# Patient Record
Sex: Female | Born: 1965 | Race: White | Hispanic: No | Marital: Married
Health system: Southern US, Community
[De-identification: ages and names within clinical notes are randomized; demographics above are authoritative.]

---

## 1998-11-06 ENCOUNTER — Other Ambulatory Visit: Admission: RE | Admit: 1998-11-06 | Discharge: 1998-11-06 | Payer: Self-pay | Admitting: Obstetrics & Gynecology

## 2000-02-23 ENCOUNTER — Other Ambulatory Visit: Admission: RE | Admit: 2000-02-23 | Discharge: 2000-02-23 | Payer: Self-pay | Admitting: Obstetrics & Gynecology

## 2001-06-12 ENCOUNTER — Other Ambulatory Visit: Admission: RE | Admit: 2001-06-12 | Discharge: 2001-06-12 | Payer: Self-pay | Admitting: Obstetrics & Gynecology

## 2003-10-08 ENCOUNTER — Other Ambulatory Visit: Admission: RE | Admit: 2003-10-08 | Discharge: 2003-10-08 | Payer: Self-pay | Admitting: Obstetrics & Gynecology

## 2004-10-19 ENCOUNTER — Other Ambulatory Visit: Admission: RE | Admit: 2004-10-19 | Discharge: 2004-10-19 | Payer: Self-pay | Admitting: Obstetrics & Gynecology

## 2005-11-10 ENCOUNTER — Ambulatory Visit (HOSPITAL_COMMUNITY): Admission: RE | Admit: 2005-11-10 | Discharge: 2005-11-11 | Payer: Self-pay | Admitting: Obstetrics and Gynecology

## 2015-12-02 ENCOUNTER — Other Ambulatory Visit: Payer: Self-pay | Admitting: Family Medicine

## 2015-12-02 DIAGNOSIS — E041 Nontoxic single thyroid nodule: Secondary | ICD-10-CM

## 2015-12-10 ENCOUNTER — Ambulatory Visit
Admission: RE | Admit: 2015-12-10 | Discharge: 2015-12-10 | Disposition: A | Discharge status: Home / Self Care | Payer: BC Managed Care – PPO | Source: Ambulatory Visit | Attending: Family Medicine | Admitting: Family Medicine

## 2015-12-10 DIAGNOSIS — E041 Nontoxic single thyroid nodule: Secondary | ICD-10-CM

## 2015-12-23 ENCOUNTER — Other Ambulatory Visit: Payer: Self-pay | Admitting: Family Medicine

## 2015-12-23 DIAGNOSIS — E041 Nontoxic single thyroid nodule: Secondary | ICD-10-CM

## 2015-12-30 ENCOUNTER — Ambulatory Visit
Admission: RE | Admit: 2015-12-30 | Discharge: 2015-12-30 | Disposition: A | Discharge status: Home / Self Care | Payer: BC Managed Care – PPO | Source: Ambulatory Visit | Attending: Family Medicine | Admitting: Family Medicine

## 2015-12-30 ENCOUNTER — Other Ambulatory Visit (HOSPITAL_COMMUNITY)
Admission: RE | Admit: 2015-12-30 | Discharge: 2015-12-30 | Disposition: A | Discharge status: Home / Self Care | Payer: BC Managed Care – PPO | Source: Ambulatory Visit | Attending: Radiology | Admitting: Radiology

## 2015-12-30 DIAGNOSIS — E041 Nontoxic single thyroid nodule: Secondary | ICD-10-CM | POA: Diagnosis present

## 2017-09-22 ENCOUNTER — Other Ambulatory Visit (HOSPITAL_COMMUNITY): Payer: Self-pay | Admitting: Family Medicine

## 2017-09-22 ENCOUNTER — Ambulatory Visit (HOSPITAL_COMMUNITY)
Admission: RE | Admit: 2017-09-22 | Discharge: 2017-09-22 | Disposition: A | Discharge status: Home / Self Care | Payer: BC Managed Care – PPO | Source: Ambulatory Visit | Attending: Family Medicine | Admitting: Family Medicine

## 2017-09-22 DIAGNOSIS — I10 Essential (primary) hypertension: Secondary | ICD-10-CM | POA: Insufficient documentation

## 2017-09-22 NOTE — Progress Notes (Signed)
Renal artery duplex prelim: no evidence of renal artery stenosis. Farrel DemarkJill Eunice, RDMS, RVT

## 2021-12-01 ENCOUNTER — Other Ambulatory Visit: Payer: Self-pay | Admitting: Obstetrics & Gynecology

## 2021-12-01 DIAGNOSIS — N6310 Unspecified lump in the right breast, unspecified quadrant: Secondary | ICD-10-CM

## 2021-12-13 ENCOUNTER — Ambulatory Visit
Admission: RE | Admit: 2021-12-13 | Discharge: 2021-12-13 | Disposition: A | Discharge status: Home / Self Care | Payer: PRIVATE HEALTH INSURANCE | Source: Ambulatory Visit | Attending: Obstetrics & Gynecology | Admitting: Obstetrics & Gynecology

## 2021-12-13 ENCOUNTER — Ambulatory Visit
Admission: RE | Admit: 2021-12-13 | Discharge: 2021-12-13 | Disposition: A | Discharge status: Home / Self Care | Payer: BC Managed Care – PPO | Source: Ambulatory Visit | Attending: Obstetrics & Gynecology | Admitting: Obstetrics & Gynecology

## 2021-12-13 DIAGNOSIS — N6310 Unspecified lump in the right breast, unspecified quadrant: Secondary | ICD-10-CM

## 2023-11-29 IMAGING — MG DIGITAL DIAGNOSTIC BILAT W/ TOMO W/ CAD
6 of 10 series · 6 of 30 positions shown · non-contrast
Comparison: Previous exam(s).

CLINICAL DATA: 56-year-old female with palpable OUTER RIGHT breast
mass identified on self-examination, and for annual bilateral
mammogram.

EXAM:
DIGITAL DIAGNOSTIC BILATERAL MAMMOGRAM WITH TOMOSYNTHESIS AND CAD;
ULTRASOUND RIGHT BREAST LIMITED
TECHNIQUE: Bilateral digital diagnostic mammography and breast tomosynthesis
was performed. The images were evaluated with computer-aided
detection.; Targeted ultrasound examination of the right breast was
performed

[L CC synth-2D]
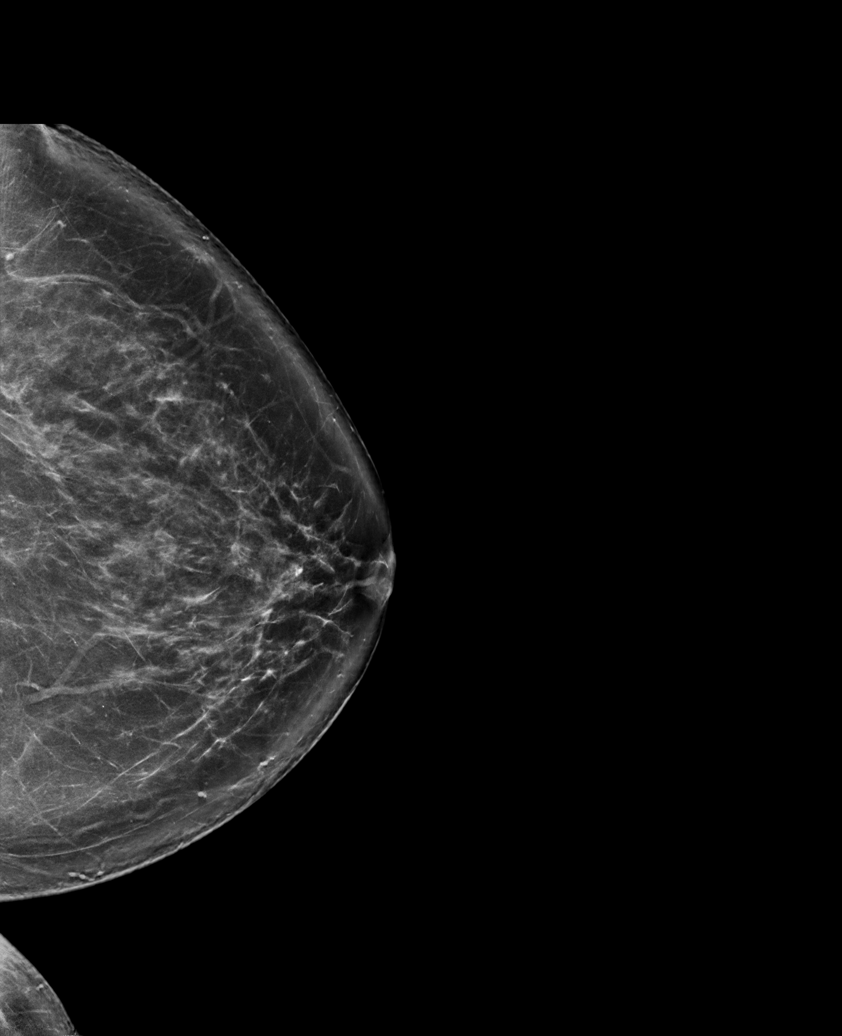

[L MLO synth-2D]
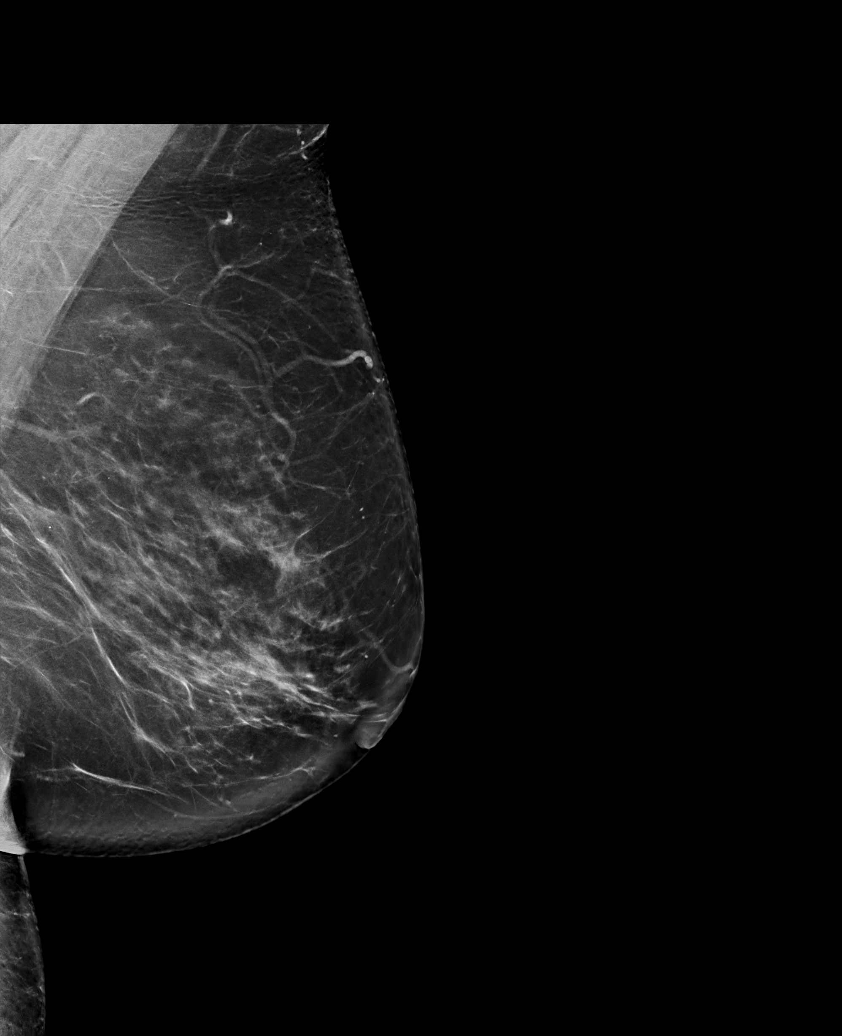

[R TAN synth-2D]
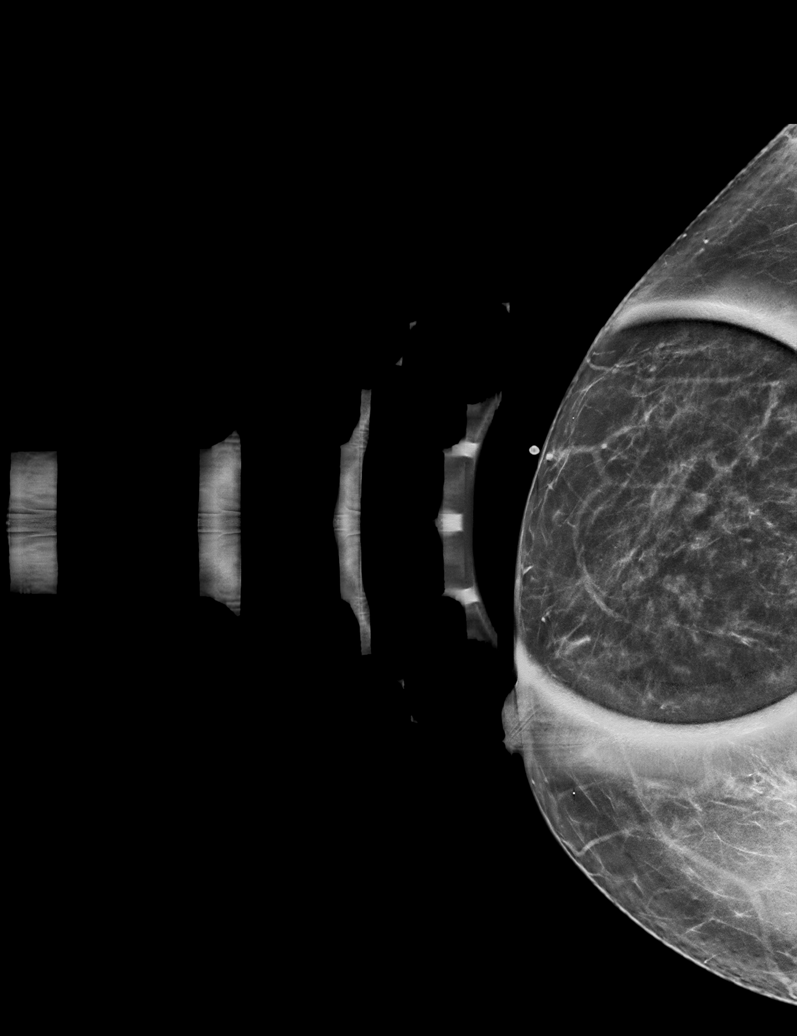

[R CC synth-2D]
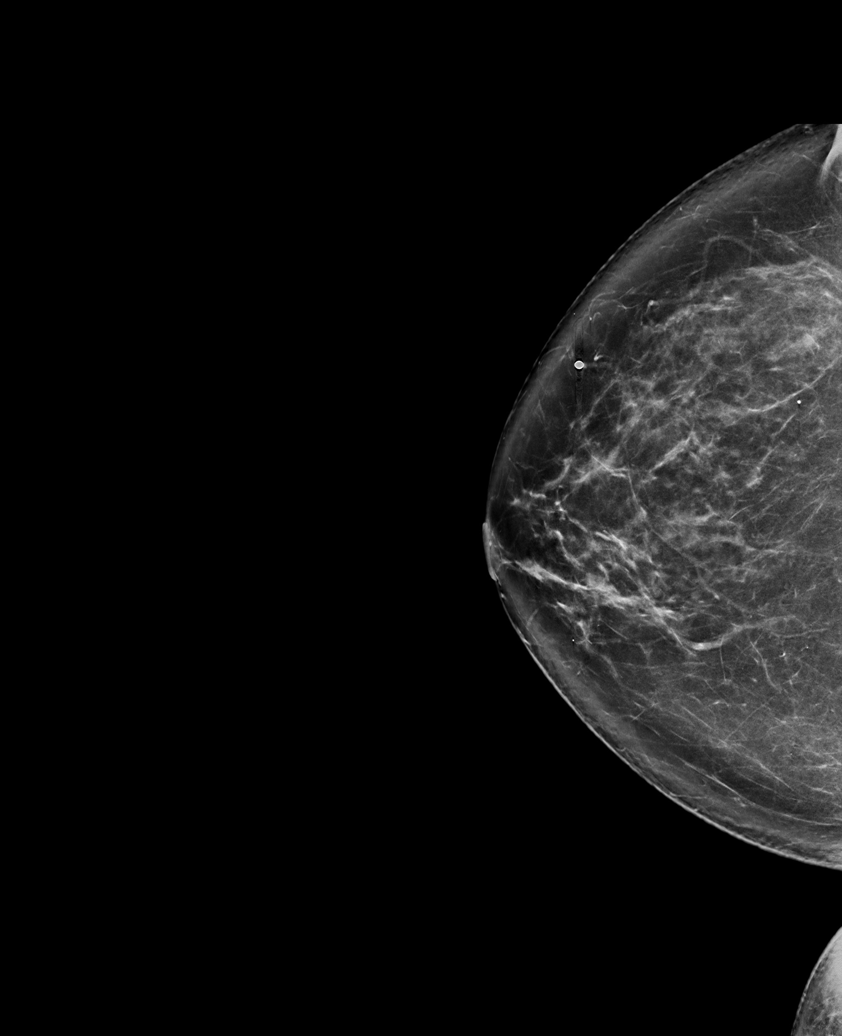

[R MLO synth-2D]
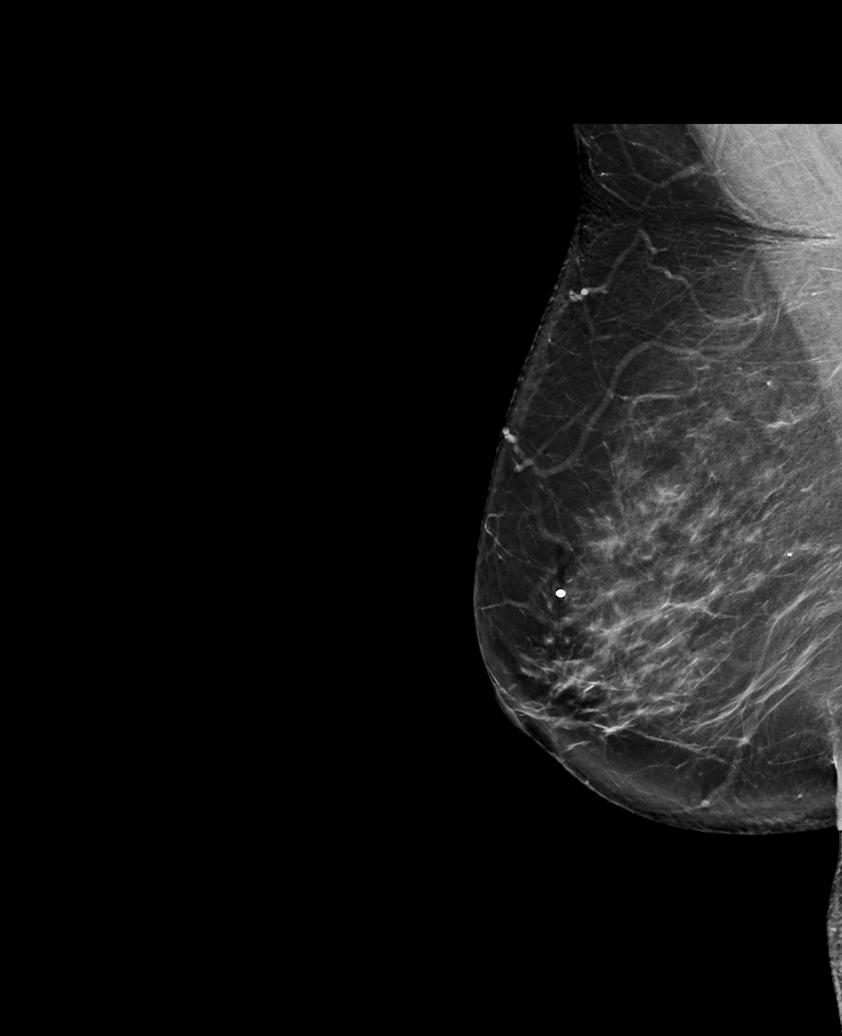

[L CC tomo · tomo slice 45/89.0]
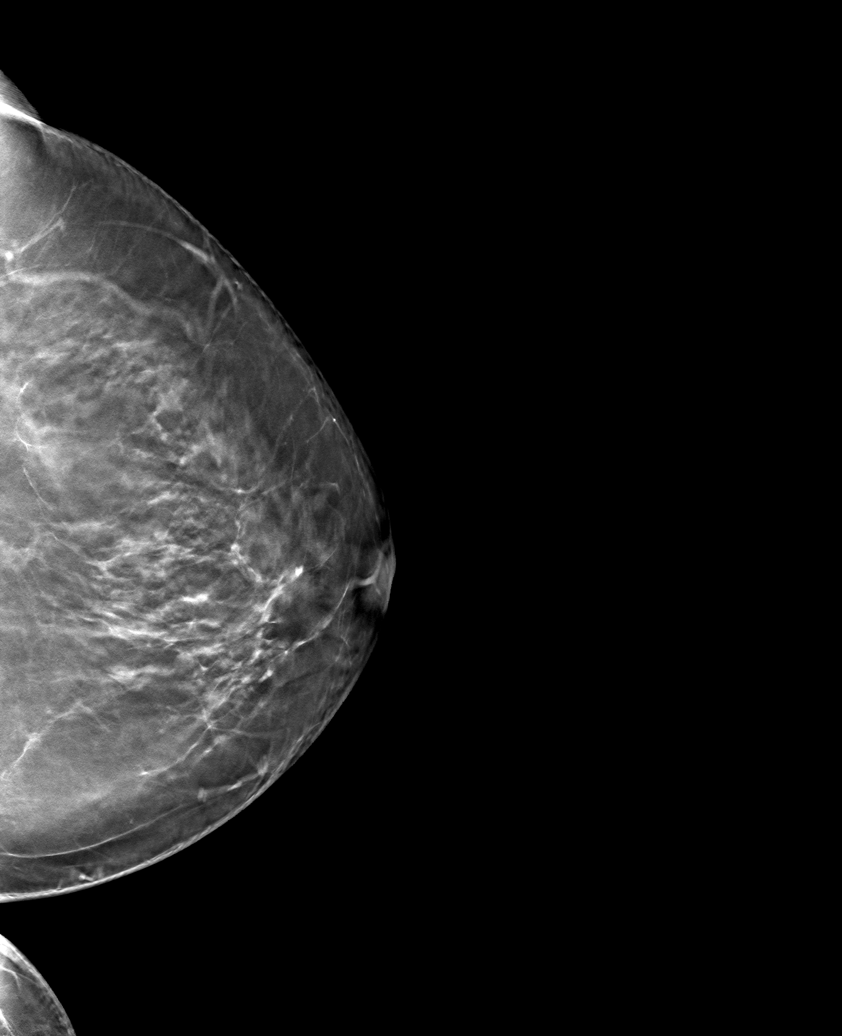

[6 of 30 positions shown; findings below may reference images not displayed]

ACR Breast Density Category b: There are scattered areas of
fibroglandular density.
FINDINGS: Full field views of both breasts and spot compression view of the
RIGHT breast demonstrate no suspicious mass, distortion or worrisome
calcifications.

On physical exam, minimal thickening in the OUTER RIGHT breast
identified without discrete palpable mass.

Targeted ultrasound is performed, showing no suspicious mass,
distortion or worrisome shadowing within the OUTER RIGHT breast, in
the area of patient concern. Normal dense fibroglandular tissue in
this area is identified.
IMPRESSION: 1. No mammographic, suspicious palpable or sonographic abnormality
within the OUTER RIGHT breast, in the area of patient concern.
2. No mammographic evidence of breast malignancy.

RECOMMENDATION:
Bilateral screening mammogram in 1 year.

I have discussed the findings and recommendations with the patient.
If applicable, a reminder letter will be sent to the patient
regarding the next appointment.

BI-RADS CATEGORY  1: Negative.

## 2024-02-19 ENCOUNTER — Other Ambulatory Visit: Payer: Self-pay | Admitting: Medical Genetics

## 2024-02-22 ENCOUNTER — Other Ambulatory Visit
Admission: RE | Admit: 2024-02-22 | Discharge: 2024-02-22 | Disposition: A | Discharge status: Home / Self Care | Payer: Self-pay | Source: Ambulatory Visit | Attending: Medical Genetics | Admitting: Medical Genetics

## 2024-03-04 LAB — GENECONNECT MOLECULAR SCREEN: Genetic Analysis Overall Interpretation: NEGATIVE
